# Patient Record
Sex: Male | Born: 1955
Health system: Southern US, Community
[De-identification: ages and names within clinical notes are randomized; demographics above are authoritative.]

## PROBLEM LIST (undated history)

## (undated) DIAGNOSIS — E669 Obesity, unspecified: Secondary | ICD-10-CM

## (undated) DIAGNOSIS — G4733 Obstructive sleep apnea (adult) (pediatric): Secondary | ICD-10-CM

## (undated) DIAGNOSIS — I82409 Acute embolism and thrombosis of unspecified deep veins of unspecified lower extremity: Secondary | ICD-10-CM

## (undated) DIAGNOSIS — S8292XA Unspecified fracture of left lower leg, initial encounter for closed fracture: Secondary | ICD-10-CM

## (undated) HISTORY — PX: EYE SURGERY: SHX253

## (undated) HISTORY — DX: Unspecified fracture of left lower leg, initial encounter for closed fracture: S82.92XA

## (undated) HISTORY — DX: Acute embolism and thrombosis of unspecified deep veins of unspecified lower extremity: I82.409

## (undated) HISTORY — DX: Obesity, unspecified: E66.9

## (undated) HISTORY — DX: Obstructive sleep apnea (adult) (pediatric): G47.33

---

## 1998-10-05 ENCOUNTER — Ambulatory Visit: Admission: RE | Admit: 1998-10-05 | Discharge: 1998-10-05 | Payer: Self-pay | Admitting: Internal Medicine

## 2003-04-19 ENCOUNTER — Ambulatory Visit (HOSPITAL_COMMUNITY): Admission: RE | Admit: 2003-04-19 | Discharge: 2003-04-20 | Payer: Self-pay | Admitting: Ophthalmology

## 2013-10-25 ENCOUNTER — Encounter: Payer: Self-pay | Admitting: General Surgery

## 2013-10-25 DIAGNOSIS — E669 Obesity, unspecified: Secondary | ICD-10-CM

## 2013-10-25 DIAGNOSIS — G4733 Obstructive sleep apnea (adult) (pediatric): Secondary | ICD-10-CM | POA: Insufficient documentation

## 2013-11-05 ENCOUNTER — Encounter: Payer: Self-pay | Admitting: Cardiology

## 2013-11-05 ENCOUNTER — Ambulatory Visit (INDEPENDENT_AMBULATORY_CARE_PROVIDER_SITE_OTHER): Payer: BC Managed Care – PPO | Admitting: Cardiology

## 2013-11-05 VITALS — BP 136/88 | HR 68 | Ht 74.5 in | Wt 280.0 lb

## 2013-11-05 DIAGNOSIS — G4733 Obstructive sleep apnea (adult) (pediatric): Secondary | ICD-10-CM

## 2013-11-05 DIAGNOSIS — E669 Obesity, unspecified: Secondary | ICD-10-CM

## 2013-11-05 NOTE — Progress Notes (Signed)
  [address] [place], [place]  [address] Phone: [phone]([phone] Fax:  [phone]) [phone]  Date:  11/05/2013   ID:  Bryan ShoveRichard V Stevenson, DOB 01/04/1956, MRN [id number]  PCP:  Lenora BoysFRIED, ROBERT L, MD  Cardiologist:  Armanda Magicraci Julianny Milstein, MD     History of Present Illness: Bryan Stevenson is a 58 y.o. male with a history of OSA on CPAP.  He is doing well with his CPAP therapy.  He tolerates his full face mask with any problems.  He feels the pressure is adequate.  He feels rested in the am and has no daytime sleepiness.  He denies any nasal congestion or eye irritation.  He is not doing as much exercise as he had been.   Wt Readings from Last 3 Encounters:  11/05/13 280 lb (127.007 kg)     Past Medical History  Diagnosis Date  . OSA (obstructive sleep apnea)     on CPAP per Dr Mayford Knifeurner- PSG 11/08 showed AHI of 12.55/hr  . Obesity     Current Outpatient Prescriptions  Medication Sig Dispense Refill  . aspirin 81 MG tablet Take 81 mg by mouth daily.      . Cholecalciferol (VITAMIN D) 2000 UNITS tablet Take 2,000 Units by mouth daily.      . Multiple Vitamins-Minerals (CENTRUM SILVER PO) Take by mouth daily.      . NON FORMULARY CPAP      . vitamin C (ASCORBIC ACID) 500 MG tablet Take 500 mg by mouth daily.       No current facility-administered medications for this visit.    Allergies:   No Known Allergies  Social History:  The patient  reports that he has never smoked. He does not have any smokeless tobacco history on file. He reports that he drinks alcohol. He reports that he does not use illicit drugs.   Family History:  The patient's family history is not on file.   ROS:  Please see the history of present illness.      All other systems reviewed and negative.   PHYSICAL EXAM: VS:  BP 136/88  Pulse 68  Ht 6' 2.5" (1.892 m)  Wt 280 lb (127.007 kg)  BMI 35.48 kg/m2 Well nourished, well developed, in no acute distress HEENT: normal Neck: no JVD Cardiac:  normal S1, S2; RRR; no  murmur Lungs:  clear to auscultation bilaterally, no wheezing, rhonchi or rales Abd: soft, nontender, no hepatomegaly Ext: no edema Skin: warm and dry Neuro:  CNs 2-12 intact, no focal abnormalities noted       ASSESSMENT AND PLAN:  1. OSA on CPAP and tolerating well.  His download today showed an AHI of 2.4/hr on 12cm H2O and is 100% compliant in using more than 4 hours nightly.  He will continue on current settings. 2. Obesity - I have encouraged him to get back in a routine exercise program.  Followup with me in 1 year  Signed, Armanda Magicraci Jamee Pacholski, MD 11/05/2013 3:33 PM

## 2013-11-05 NOTE — Patient Instructions (Signed)
Your physician recommends that you continue on your current medications as directed. Please refer to the Current Medication list given to you today. Your physician wants you to follow-up in: 12 months with Dr. Turner.  You will receive a reminder letter in the mail two months in advance. If you don't receive a letter, please call our office to schedule the follow-up appointment.  

## 2013-11-06 ENCOUNTER — Other Ambulatory Visit: Payer: Self-pay | Admitting: General Surgery

## 2013-11-06 DIAGNOSIS — G4733 Obstructive sleep apnea (adult) (pediatric): Secondary | ICD-10-CM

## 2013-11-08 ENCOUNTER — Ambulatory Visit: Payer: Self-pay | Admitting: Cardiology

## 2013-12-09 ENCOUNTER — Encounter: Payer: Self-pay | Admitting: Cardiology

## 2013-12-20 ENCOUNTER — Encounter: Payer: Self-pay | Admitting: General Surgery

## 2013-12-24 ENCOUNTER — Telehealth: Payer: Self-pay | Admitting: Cardiology

## 2013-12-24 NOTE — Telephone Encounter (Signed)
New message ° ° ° ° ° °Returning a nurses call from last week °

## 2013-12-24 NOTE — Telephone Encounter (Signed)
Follow up ° ° ° ° °Returning a nurses call °

## 2013-12-24 NOTE — Telephone Encounter (Signed)
Made pt aware of download results.

## 2014-02-10 ENCOUNTER — Encounter: Payer: Self-pay | Admitting: Cardiology

## 2014-02-14 ENCOUNTER — Encounter: Payer: Self-pay | Admitting: General Surgery

## 2014-08-07 ENCOUNTER — Encounter: Payer: Self-pay | Admitting: Cardiology

## 2014-11-05 ENCOUNTER — Ambulatory Visit (INDEPENDENT_AMBULATORY_CARE_PROVIDER_SITE_OTHER): Payer: BLUE CROSS/BLUE SHIELD | Admitting: Cardiology

## 2014-11-05 ENCOUNTER — Encounter: Payer: Self-pay | Admitting: Cardiology

## 2014-11-05 VITALS — BP 110/78 | HR 89 | Ht 74.5 in | Wt 283.8 lb

## 2014-11-05 DIAGNOSIS — E669 Obesity, unspecified: Secondary | ICD-10-CM | POA: Diagnosis not present

## 2014-11-05 DIAGNOSIS — G4733 Obstructive sleep apnea (adult) (pediatric): Secondary | ICD-10-CM

## 2014-11-05 NOTE — Progress Notes (Signed)
Cardiology Office Note   Date:  11/05/2014   ID:  Bryan ShoveRichard V Caracci, DOB 10/04/1955, MRN [id number]  PCP:  Lenora BoysFRIED, ROBERT L, MD    Chief Complaint  Patient presents with  . Follow-up    Sleep Apnea      History of Present Illness:  Bryan Stevenson is a 59 y.o. male with a history of OSA on CPAP. He is doing well with his CPAP therapy. He tolerates his full face mask with any problems. He feels the pressure is adequate. He feels rested in the am and has no daytime sleepiness. He denies any nasal congestion or eye irritation. He is walking and going to the gym for exercise.   Past Medical History  Diagnosis Date  . OSA (obstructive sleep apnea)     on CPAP per Dr Mayford Knifeurner- PSG 11/08 showed AHI of 12.55/hr  . Obesity     Past Surgical History  Procedure Laterality Date  . Eye surgery Right      Current Outpatient Prescriptions  Medication Sig Dispense Refill  . aspirin 81 MG tablet Take 81 mg by mouth daily.    . Cholecalciferol (VITAMIN D) 2000 UNITS tablet Take 2,000 Units by mouth daily.    . Multiple Vitamins-Minerals (CENTRUM SILVER PO) Take by mouth daily.    . NON FORMULARY CPAP     No current facility-administered medications for this visit.    Allergies:   Review of patient's allergies indicates no known allergies.    Social History:  The patient  reports that he has never smoked. He does not have any smokeless tobacco history on file. He reports that he drinks alcohol. He reports that he does not use illicit drugs.   Family History:  The patient's family history includes Breast cancer in his mother; Diabetes type II in his father; Stroke in his father.    ROS:  Please see the history of present illness.   Otherwise, review of systems are positive for none.   All other systems are reviewed and negative.    PHYSICAL EXAM: VS:  BP 110/78 mmHg  Pulse 89  Ht 6' 2.5" (1.892 m)  Wt 283 lb 12.8 oz (128.731 kg)  BMI 35.96 kg/m2  SpO2 94% , BMI Body mass  index is 35.96 kg/(m^2). GEN: Well nourished, well developed, in no acute distress HEENT: normal Neck: no JVD, carotid bruits, or masses Cardiac: RRR; no murmurs, rubs, or gallops,no edema  Respiratory:  clear to auscultation bilaterally, normal work of breathing GI: soft, nontender, nondistended, + BS MS: no deformity or atrophy Skin: warm and dry, no rash Neuro:  Strength and sensation are intact Psych: euthymic mood, full affect   EKG:  EKG is not ordered today.    Recent Labs: No results found for requested labs within last 365 days.    Lipid Panel No results found for: CHOL, TRIG, HDL, CHOLHDL, VLDL, LDLCALC, LDLDIRECT    Wt Readings from Last 3 Encounters:  11/05/14 283 lb 12.8 oz (128.731 kg)  11/05/13 280 lb (127.007 kg)     ASSESSMENT AND PLAN:  1. OSA on CPAP and tolerating well. His download today showed an AHI of 1.4/hr on 12cm H2O and is 99% compliant in using more than 4 hours nightly. He will continue on current settings. 2. Obesity - I have encouraged him to get back in a routine exercise program.   Current medicines are reviewed at length with the patient today.  The patient does not have  concerns regarding medicines.  The following changes have been made:  no change  Labs/ tests ordered today include: see above assessment and plan No orders of the defined types were placed in this encounter.     Disposition:   FU with me in 1 year   Signed, Quintella ReichertURNER,Taavi Hoose R, MD  11/05/2014 8:22 AM    [facility] [address], [place], [place]  [address] Phone: [phone](336) [phone]; Fax: [phone](336) [phone]

## 2014-11-05 NOTE — Patient Instructions (Signed)

## 2015-10-01 ENCOUNTER — Other Ambulatory Visit: Payer: Self-pay | Admitting: Orthopedic Surgery

## 2015-10-01 ENCOUNTER — Ambulatory Visit (HOSPITAL_COMMUNITY)
Admission: RE | Admit: 2015-10-01 | Discharge: 2015-10-01 | Disposition: A | Discharge status: Home / Self Care | Payer: Managed Care, Other (non HMO) | Source: Ambulatory Visit | Attending: Cardiology | Admitting: Cardiology

## 2015-10-01 DIAGNOSIS — I82442 Acute embolism and thrombosis of left tibial vein: Secondary | ICD-10-CM | POA: Diagnosis not present

## 2015-10-01 DIAGNOSIS — M79605 Pain in left leg: Secondary | ICD-10-CM | POA: Insufficient documentation

## 2015-10-01 DIAGNOSIS — M7989 Other specified soft tissue disorders: Secondary | ICD-10-CM | POA: Insufficient documentation

## 2015-10-01 DIAGNOSIS — M25562 Pain in left knee: Secondary | ICD-10-CM

## 2015-10-01 DIAGNOSIS — G4733 Obstructive sleep apnea (adult) (pediatric): Secondary | ICD-10-CM | POA: Insufficient documentation

## 2015-10-01 DIAGNOSIS — I82432 Acute embolism and thrombosis of left popliteal vein: Secondary | ICD-10-CM | POA: Diagnosis not present

## 2015-10-01 DIAGNOSIS — I82890 Acute embolism and thrombosis of other specified veins: Secondary | ICD-10-CM | POA: Diagnosis not present

## 2015-10-01 DIAGNOSIS — M79662 Pain in left lower leg: Secondary | ICD-10-CM

## 2015-10-03 ENCOUNTER — Ambulatory Visit
Admission: RE | Admit: 2015-10-03 | Discharge: 2015-10-03 | Disposition: A | Discharge status: Home / Self Care | Payer: Managed Care, Other (non HMO) | Source: Ambulatory Visit | Attending: Orthopedic Surgery | Admitting: Orthopedic Surgery

## 2015-10-03 DIAGNOSIS — M25562 Pain in left knee: Secondary | ICD-10-CM

## 2015-11-14 ENCOUNTER — Ambulatory Visit: Payer: BLUE CROSS/BLUE SHIELD | Admitting: Cardiology

## 2016-01-12 ENCOUNTER — Encounter: Payer: Self-pay | Admitting: Cardiology

## 2016-01-29 ENCOUNTER — Encounter (INDEPENDENT_AMBULATORY_CARE_PROVIDER_SITE_OTHER): Payer: Self-pay

## 2016-01-29 ENCOUNTER — Encounter: Payer: Self-pay | Admitting: Cardiology

## 2016-01-29 ENCOUNTER — Ambulatory Visit (INDEPENDENT_AMBULATORY_CARE_PROVIDER_SITE_OTHER): Payer: Managed Care, Other (non HMO) | Admitting: Cardiology

## 2016-01-29 VITALS — BP 108/82 | HR 88 | Ht 75.0 in | Wt 282.2 lb

## 2016-01-29 DIAGNOSIS — G4733 Obstructive sleep apnea (adult) (pediatric): Secondary | ICD-10-CM | POA: Diagnosis not present

## 2016-01-29 DIAGNOSIS — S8292XD Unspecified fracture of left lower leg, subsequent encounter for closed fracture with routine healing: Secondary | ICD-10-CM | POA: Diagnosis not present

## 2016-01-29 DIAGNOSIS — E669 Obesity, unspecified: Secondary | ICD-10-CM | POA: Diagnosis not present

## 2016-01-29 DIAGNOSIS — I82402 Acute embolism and thrombosis of unspecified deep veins of left lower extremity: Secondary | ICD-10-CM | POA: Diagnosis not present

## 2016-01-29 DIAGNOSIS — S8292XA Unspecified fracture of left lower leg, initial encounter for closed fracture: Secondary | ICD-10-CM | POA: Insufficient documentation

## 2016-01-29 DIAGNOSIS — I82409 Acute embolism and thrombosis of unspecified deep veins of unspecified lower extremity: Secondary | ICD-10-CM | POA: Insufficient documentation

## 2016-01-29 NOTE — Patient Instructions (Signed)

## 2016-01-29 NOTE — Progress Notes (Signed)
Cardiology Office Note    Date:  01/29/2016   ID:  NIKITA HUMBLE, DOB 1956/04/06, MRN [id number]  PCP:  Joycelyn Rua, MD  Cardiologist:  Armanda Magic, MD   Chief Complaint  Patient presents with  . Sleep Apnea    History of Present Illness:  Bryan Stevenson is a 60 y.o. male with a history of OSA on CPAP. He is doing well with his CPAP therapy. He tolerates his full face mask with any problems. He feels the pressure is adequate. He feels rested in the am and has no daytime sleepiness. He denies any nasal congestion or eye irritation. he is currently in PT after falling off a ladder and breaking his leg and then getting a DVT so he has not been working out.     Past Medical History:  Diagnosis Date  . DVT (deep venous thrombosis) (HCC)    during leg fracture  . Leg fracture, left    complicated by DVT  . Obesity   . OSA (obstructive sleep apnea)    on CPAP per Dr Mayford Knife- PSG 11/08 showed AHI of 12.55/hr    Past Surgical History:  Procedure Laterality Date  . EYE SURGERY Right     Current Medications: Outpatient Medications Prior to Visit  Medication Sig Dispense Refill  . aspirin 81 MG tablet Take 81 mg by mouth daily.    . Cholecalciferol (VITAMIN D) 2000 UNITS tablet Take 2,000 Units by mouth daily.    . Multiple Vitamins-Minerals (CENTRUM SILVER PO) Take by mouth daily.    . NON FORMULARY CPAP     No facility-administered medications prior to visit.      Allergies:   Review of patient's allergies indicates no known allergies.   Social History   Social History  . Marital status: Married    Spouse name: patricia  . Number of children: 2  . Years of education: college   Occupational History  . retired    Social History Main Topics  . Smoking status: Never Smoker  . Smokeless tobacco: Never Used  . Alcohol use Yes     Comment: 1-2 per week, wine  . Drug use: No  . Sexual activity: Not Asked   Other Topics Concern  . None   Social History  Narrative  . None     Family History:  The patient's family history includes Breast cancer in his mother; Diabetes type II in his father; Stroke in his father.   ROS:   Please see the history of present illness.    Review of Systems  Musculoskeletal: Positive for muscle cramps.   All other systems reviewed and are negative.   PHYSICAL EXAM:   VS:  BP 108/82   Pulse 88   Ht  (1.905 m)   Wt 282 lb 3.2 oz (128 kg)   SpO2 96%   BMI 35.27 kg/m    GEN: Well nourished, well developed, in no acute distress  HEENT: normal  Neck: no JVD, carotid bruits, or masses Cardiac: RRR; no murmurs, rubs, or gallops,no edema.  Intact distal pulses bilaterally.  Respiratory:  clear to auscultation bilaterally, normal work of breathing GI: soft, nontender, nondistended, + BS MS: no deformity or atrophy  Skin: warm and dry, no rash Neuro:  Alert and Oriented x 3, Strength and sensation are intact Psych: euthymic mood, full affect  Wt Readings from Last 3 Encounters:  01/29/16 282 lb 3.2 oz (128 kg)  11/05/14 283 lb 12.8 oz (  128.7 kg)  11/05/13 280 lb (127 kg)      Studies/Labs Reviewed:   EKG:  EKG is not ordered today.   Recent Labs: No results found for requested labs within last 8760 hours.   Lipid Panel No results found for: CHOL, TRIG, HDL, CHOLHDL, VLDL, LDLCALC, LDLDIRECT  Additional studies/ records that were reviewed today include:  CPAP d/l    ASSESSMENT:    1. OSA (obstructive sleep apnea)   2. Obesity   3. Leg fracture, left, closed, with routine healing, subsequent encounter   4. Deep vein thrombosis (DVT) of left lower extremity, unspecified chronicity, unspecified vein (HCC)      PLAN:  In order of problems listed above:  OSA - the patient is tolerating PAP therapy well without any problems. The PAP download was reviewed today and showed an AHI of 1.3/hr on 12 cm H2O with 100% compliance in using more than 4 hours nightly.  The patient has been using and  benefiting from CPAP use and will continue to benefit from therapy.  2.   Obesity - I have encouraged him to get into a routine exercise program and cut back on carbs and portions.     Medication Adjustments/Labs and Tests Ordered: Current medicines are reviewed at length with the patient today.  Concerns regarding medicines are outlined above.  Medication changes, Labs and Tests ordered today are listed in the Patient Instructions below.  There are no Patient Instructions on file for this visit.   Signed, Armanda Magicraci Turner, MD  01/29/2016 3:30 PM    [facility] [address], [place], [place]  [address] Phone: [phone](336) [phone]; Fax: [phone]

## 2016-02-11 ENCOUNTER — Telehealth: Payer: Self-pay | Admitting: Cardiology

## 2016-02-11 NOTE — Telephone Encounter (Signed)
New message    Pt verbalized that he is trying to have Dr.Turner to change the C_PAP supply company [facility] [phone]  He verbalized that he needs Dr.Turner to write him a new proscription for [facility]

## 2016-02-12 ENCOUNTER — Other Ambulatory Visit: Payer: Self-pay | Admitting: *Deleted

## 2016-02-12 DIAGNOSIS — Z9989 Dependence on other enabling machines and devices: Principal | ICD-10-CM

## 2016-02-12 DIAGNOSIS — G4733 Obstructive sleep apnea (adult) (pediatric): Secondary | ICD-10-CM

## 2016-02-12 NOTE — Telephone Encounter (Signed)
Follow Up:   Pt said he called yesterday and waiting to hear from you. He needs to talk to you about  Updating his prescription for C-Pap machine.r

## 2016-02-12 NOTE — Telephone Encounter (Signed)
Spoke with patient and he was unsure what all [facility] needed to be able to set him up for supplies.  Per his insurance, he as to go through them for supplies now.    I have called [facility] and have since faxed over:   1. Sleep Study/Titration 2. DX Code 3. Clinical Notes 4. Current Download 5. Demographic Sheet 6 Order for supplies.   Patient is aware that once [facility] gets everything and processes the order, that they will call him to let him know what he needs to do to get his supplies.    Patient was appreciative of the call, and thanked me for helping him and getting all this information to them so that he can get new supplies.

## 2017-01-24 ENCOUNTER — Encounter: Payer: Self-pay | Admitting: Cardiology

## 2017-01-24 ENCOUNTER — Ambulatory Visit (INDEPENDENT_AMBULATORY_CARE_PROVIDER_SITE_OTHER): Payer: Managed Care, Other (non HMO) | Admitting: Cardiology

## 2017-01-24 VITALS — BP 124/68 | HR 68 | Ht 75.0 in | Wt 280.4 lb

## 2017-01-24 DIAGNOSIS — G4733 Obstructive sleep apnea (adult) (pediatric): Secondary | ICD-10-CM | POA: Diagnosis not present

## 2017-01-24 DIAGNOSIS — Z6835 Body mass index (BMI) 35.0-35.9, adult: Secondary | ICD-10-CM | POA: Diagnosis not present

## 2017-01-24 NOTE — Progress Notes (Signed)
Cardiology Office Note:    Date:  01/24/2017   ID:  Bryan Stevenson, DOB 10/15/1955, MRN [id number]  PCP:  Bryan RuaMeyers, Stephen, MD  Cardiologist:  Bryan Magicraci Avi Kerschner, MD   Referring MD: Bryan RuaMeyers, Stephen, MD   Chief Complaint  Patient presents with  . Sleep Apnea    History of Present Illness:    Bryan Stevenson is a 61 y.o. male with a hx of a history of OSA on CPAP. He is here today for followup and is doing well with his CPAP therapy. He continues to tolerate his full face mask without any problems. He continues to feel the pressure is adequate and feels rested in the am with no significant daytime sleepiness. He denies any nasal congestion or eye irritation.He denies any dry mouth or nose. He started an exercise program a few weeks ago.  Past Medical History:  Diagnosis Date  . DVT (deep venous thrombosis) (HCC)    during leg fracture  . Leg fracture, left    complicated by DVT  . Obesity   . OSA (obstructive sleep apnea)    on CPAP per Dr Mayford Knifeurner- PSG 11/08 showed AHI of 12.55/hr    Past Surgical History:  Procedure Laterality Date  . EYE SURGERY Right     Current Medications: Current Meds  Medication Sig  . aspirin 81 MG tablet Take 81 mg by mouth daily.  Marland Kitchen. CALCIUM-VITAMIN D PO Take by mouth as directed.  . Multiple Vitamins-Minerals (CENTRUM SILVER PO) Take by mouth daily.  . NON FORMULARY CPAP     Allergies:   Patient has no known allergies.   Social History   Social History  . Marital status: Married    Spouse name: Bryan Stevenson  . Number of children: 2  . Years of education: college   Occupational History  . retired    Social History Main Topics  . Smoking status: Never Smoker  . Smokeless tobacco: Never Used  . Alcohol use Yes     Comment: 1-2 per week, wine  . Drug use: No  . Sexual activity: Not Asked   Other Topics Concern  . None   Social History Narrative  . None     Family History: The patient's family history includes Breast cancer in his  mother; Diabetes type II in his father; Stroke in his father.  ROS:   Please see the history of present illness.     All other systems reviewed and are negative.  EKGs/Labs/Other Studies Reviewed:    The following studies were reviewed today: CPAP download  EKG:  EKG is not ordered today.    Recent Labs: No results found for requested labs within last 8760 hours.   Recent Lipid Panel No results found for: CHOL, TRIG, HDL, CHOLHDL, VLDL, LDLCALC, LDLDIRECT  Physical Exam:    VS:  BP 124/68   Pulse 68   Ht 6\' 3"  (1.905 m)   Wt 280 lb 6.4 oz (127.2 kg)   SpO2 95%   BMI 35.05 kg/m     Wt Readings from Last 3 Encounters:  01/24/17 280 lb 6.4 oz (127.2 kg)  01/29/16 282 lb 3.2 oz (128 kg)  11/05/14 283 lb 12.8 oz (128.7 kg)     GEN:  Well nourished, well developed in no acute distress HEENT: Normal NECK: No JVD; No carotid bruits LYMPHATICS: No lymphadenopathy CARDIAC: RRR, no murmurs, rubs, gallops RESPIRATORY:  Clear to auscultation without rales, wheezing or rhonchi  ABDOMEN: Soft, non-tender, non-distended MUSCULOSKELETAL:  No  edema; No deformity  SKIN: Warm and dry NEUROLOGIC:  Alert and oriented x 3 PSYCHIATRIC:  Normal affect   ASSESSMENT:    1. OSA (obstructive sleep apnea)   2. Class 2 severe obesity due to excess calories with serious comorbidity and body mass index (BMI) of 35.0 to 35.9 in adult [facility])    PLAN:    In order of problems listed above:  OSA - the patient is tolerating PAP therapy well without any problems. The PAP download was reviewed today and showed an AHI of 1.1/hr on 12 cm H2O with 100% compliance in using more than 4 hours nightly.  The patient has been using and benefiting from CPAP use and will continue to benefit from therapy.  Obesity - I have encouraged him to continue in his routine exercise program and cut back on carbs and portions.    Medication Adjustments/Labs and Tests Ordered: Current medicines are reviewed at length  with the patient today.  Concerns regarding medicines are outlined above.  No orders of the defined types were placed in this encounter.  No orders of the defined types were placed in this encounter.   Signed, Bryan Magic, MD  01/24/2017 8:57 AM    [facility]

## 2017-01-24 NOTE — Patient Instructions (Signed)

## 2017-09-20 IMAGING — CT CT KNEE*L* W/O CM
1 series · 12 of 14 positions shown, 15 images · non-contrast
Comparison: None.

CLINICAL DATA: Right tibia and fibula fractures following a fall
from a ladder on 09/12/2015.

EXAM:
CT OF THE RIGHT KNEE WITHOUT CONTRAST
TECHNIQUE: Multidetector CT imaging of the right knee was performed according
to the standard protocol. Multiplanar CT image reconstructions were
also generated.

[Series 4: knee soft tissue · axial · 0.35mm/px · z∈[-239,-74]mm · 12 of 65 slices shown, 15 images]
[im 5/65  soft-tissue]
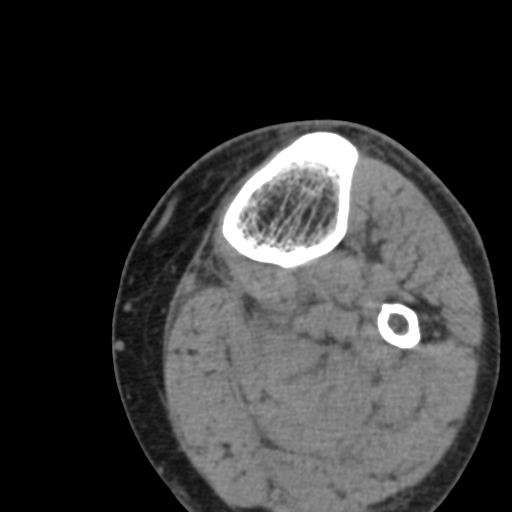
[im 5/65  bone]
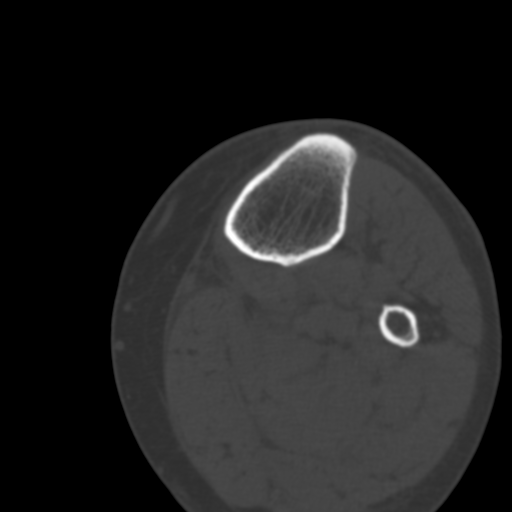
[im 10/65  bone]
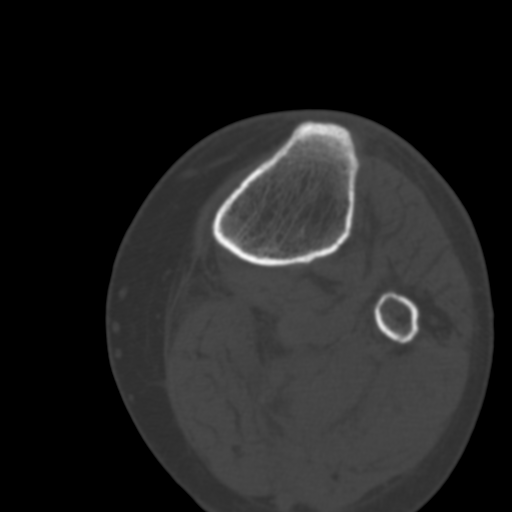
[im 15/65  bone]
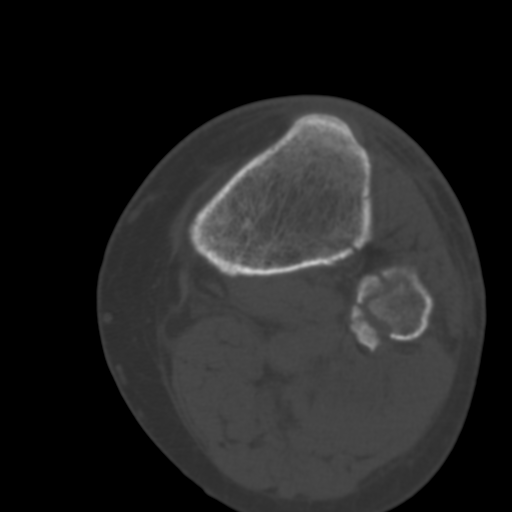
[im 20/65  bone]
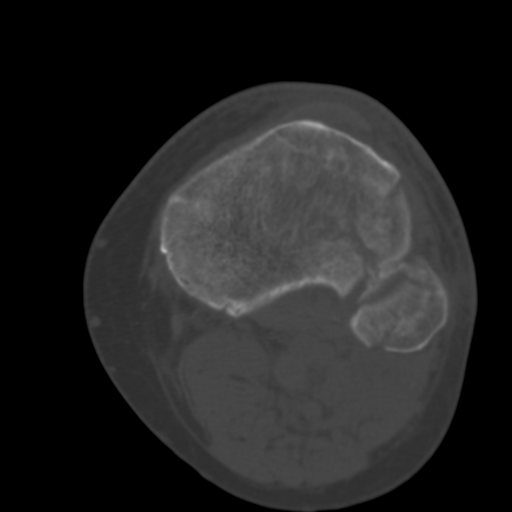
[im 25/65  soft-tissue]
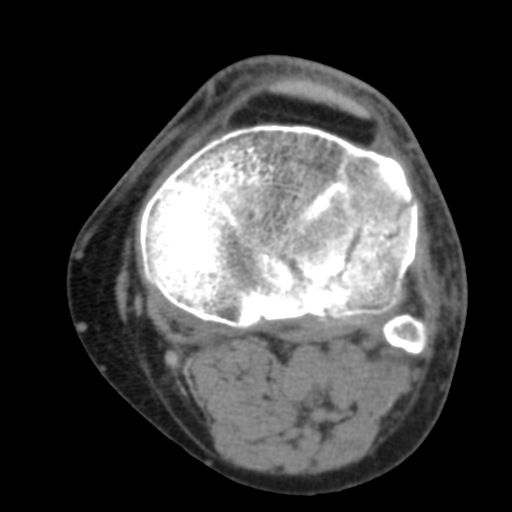
[im 25/65  bone]
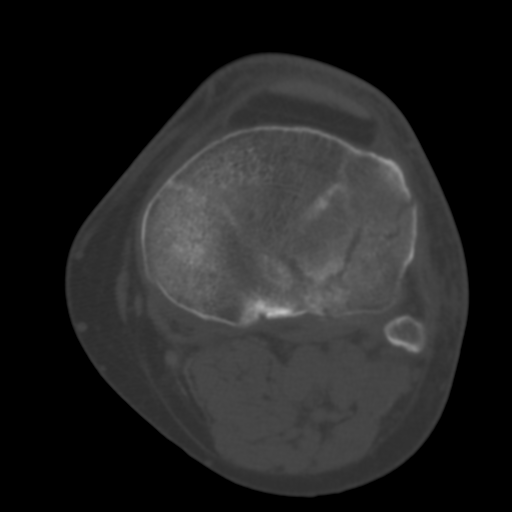
[im 30/65  bone]
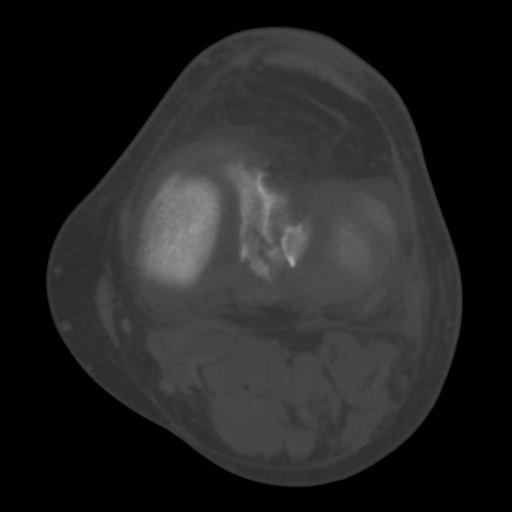
[im 35/65  bone]
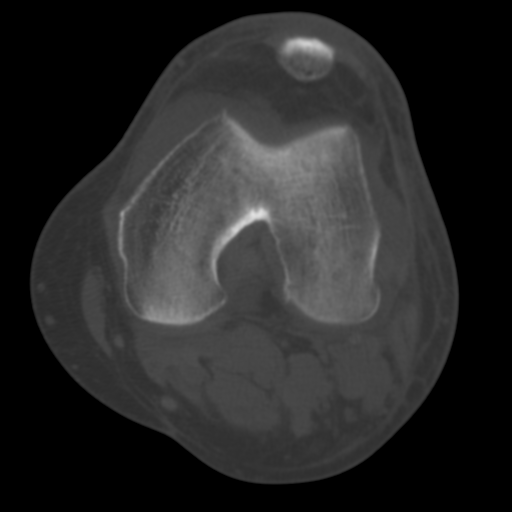
[im 40/65  bone]
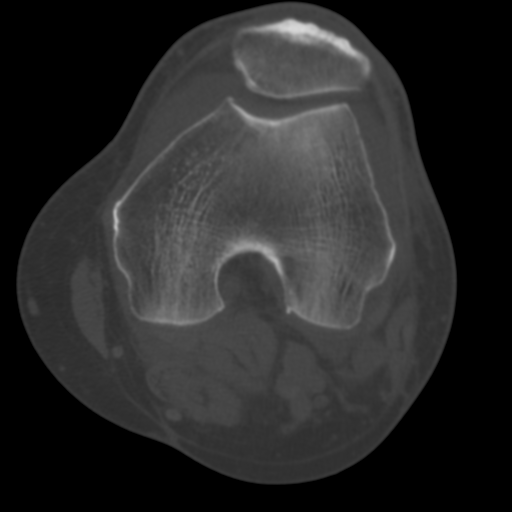
[im 45/65  soft-tissue]
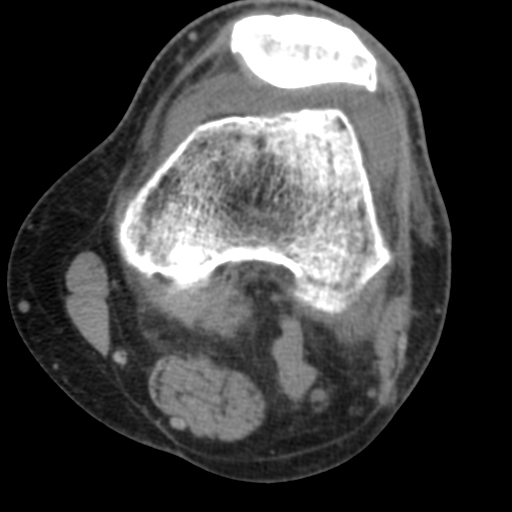
[im 45/65  bone]
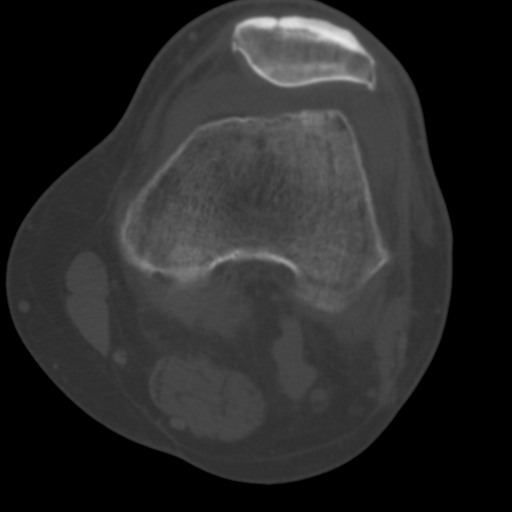
[im 50/65  bone]
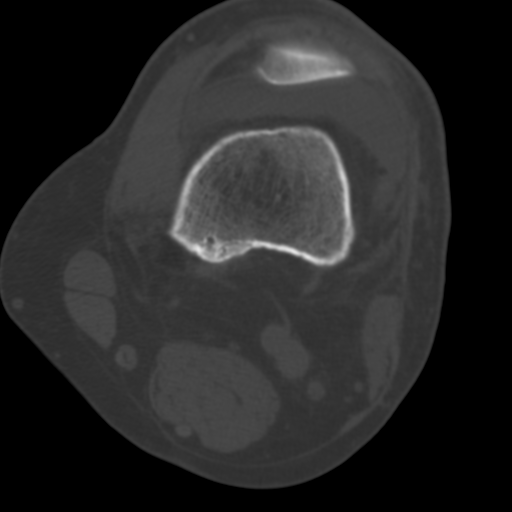
[im 55/65  bone]
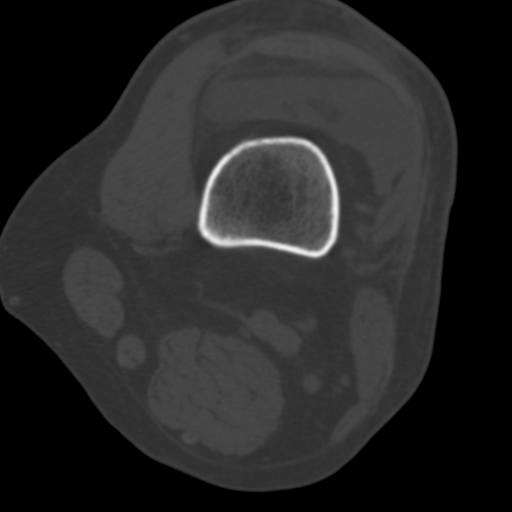
[im 60/65  bone]
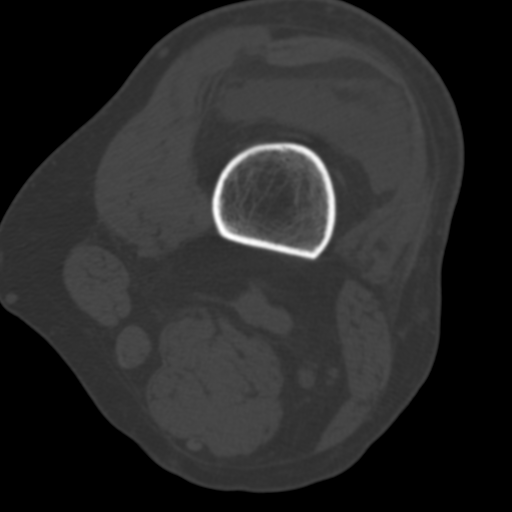

[12 of 14 positions shown; findings below may reference images not displayed]

FINDINGS: Comminuted lateral tibial plateau fracture, involving the tibial
spines. 3 mm of depression of the central portion of the lateral
tibial plateau. There is also a comminuted fracture of the fibular
head and neck. A moderately large effusion is noted.
IMPRESSION: 1. Comminuted lateral tibial plateau fracture with 3 mm of
depression centrally.
2. Comminuted fibular head and neck fracture.
3. Moderately large effusion.

## 2018-01-31 NOTE — Progress Notes (Signed)
Cardiology Office Note:    Date:  02/01/2018   ID:  Bryan Stevenson, DOB 11/19/1955, MRN [id number]  PCP:  Joycelyn RuaMeyers, Stephen, MD  Cardiologist:  No primary care provider on file.    Referring MD: Joycelyn RuaMeyers, Stephen, MD   Chief Complaint  Patient presents with  . Sleep Apnea    History of Present Illness:    Bryan Stevenson is a 62 y.o. male with a hx of OSA on CPAP.  He is doing well with his CPAP device.  He tolerates the mask and feels the pressure is adequate.  Since going on CPAP he feels rested in the am and has no significant daytime sleepiness.  He denies any significant mouth or nasal dryness or nasal congestion.  He does not think that he snores.     Past Medical History:  Diagnosis Date  . DVT (deep venous thrombosis) (HCC)    during leg fracture  . Leg fracture, left    complicated by DVT  . Obesity   . OSA (obstructive sleep apnea)    on CPAP per Dr Mayford Knifeurner- PSG 11/08 showed AHI of 12.55/hr    Past Surgical History:  Procedure Laterality Date  . EYE SURGERY Right     Current Medications: Current Meds  Medication Sig  . aspirin 81 MG tablet Take 81 mg by mouth daily.  Marland Kitchen. CALCIUM-VITAMIN D PO Take by mouth as directed.  . Multiple Vitamins-Minerals (CENTRUM SILVER PO) Take by mouth daily.  . NON FORMULARY CPAP     Allergies:   Patient has no known allergies.   Social History   Socioeconomic History  . Marital status: Married    Spouse name: patricia  . Number of children: 2  . Years of education: college  . Highest education level: Not on file  Occupational History  . Occupation: retired  Engineer, productionocial Needs  . Financial resource strain: Not on file  . Food insecurity:    Worry: Not on file    Inability: Not on file  . Transportation needs:    Medical: Not on file    Non-medical: Not on file  Tobacco Use  . Smoking status: Never Smoker  . Smokeless tobacco: Never Used  Substance and Sexual Activity  . Alcohol use: Yes    Comment: 1-2 per week, wine  .  Drug use: No  . Sexual activity: Not on file  Lifestyle  . Physical activity:    Days per week: Not on file    Minutes per session: Not on file  . Stress: Not on file  Relationships  . Social connections:    Talks on phone: Not on file    Gets together: Not on file    Attends religious service: Not on file    Active member of club or organization: Not on file    Attends meetings of clubs or organizations: Not on file    Relationship status: Not on file  Other Topics Concern  . Not on file  Social History Narrative  . Not on file     Family History: The patient's family history includes Breast cancer in his mother; Diabetes type II in his father; Stroke in his father.  ROS:   Please see the history of present illness.    ROS  All other systems reviewed and negative.   EKGs/Labs/Other Studies Reviewed:    The following studies were reviewed today: PAP download  EKG:  EKG is not ordered today.    Recent Labs: No  results found for requested labs within last 8760 hours.   Recent Lipid Panel No results found for: CHOL, TRIG, HDL, CHOLHDL, VLDL, LDLCALC, LDLDIRECT  Physical Exam:    VS:  BP 122/76   Pulse 89   Ht 6\' 3"  (1.905 m)   Wt 287 lb 12.8 oz (130.5 kg)   SpO2 97%   BMI 35.97 kg/m     Wt Readings from Last 3 Encounters:  02/01/18 287 lb 12.8 oz (130.5 kg)  01/24/17 280 lb 6.4 oz (127.2 kg)  01/29/16 282 lb 3.2 oz (128 kg)     GEN:  Well nourished, well developed in no acute distress HEENT: Normal NECK: No JVD; No carotid bruits LYMPHATICS: No lymphadenopathy CARDIAC: RRR, no murmurs, rubs, gallops RESPIRATORY:  Clear to auscultation without rales, wheezing or rhonchi  ABDOMEN: Soft, non-tender, non-distended MUSCULOSKELETAL:  No edema; No deformity  SKIN: Warm and dry NEUROLOGIC:  Alert and oriented x 3 PSYCHIATRIC:  Normal affect   ASSESSMENT:    1. OSA (obstructive sleep apnea)   2. Class 2 severe obesity due to excess calories with serious  comorbidity in adult, unspecified BMI (HCC)    PLAN:    In order of problems listed above:  1.  OSA - the patient is tolerating PAP therapy well without any problems. The PAP download was reviewed today and showed an AHI of 1.2/hr on 12 cm H2O with 100% compliance in using more than 4 hours nightly.  The patient has been using and benefiting from PAP use and will continue to benefit from therapy.   2.  Obesity - I have encouraged him to get into a routine exercise program and cut back on carbs and portions.     Medication Adjustments/Labs and Tests Ordered: Current medicines are reviewed at length with the patient today.  Concerns regarding medicines are outlined above.  No orders of the defined types were placed in this encounter.  No orders of the defined types were placed in this encounter.   Signed, Armanda Magicraci Turner, MD  02/01/2018 8:38 AM    [facility]

## 2018-02-01 ENCOUNTER — Ambulatory Visit (INDEPENDENT_AMBULATORY_CARE_PROVIDER_SITE_OTHER): Payer: Managed Care, Other (non HMO) | Admitting: Cardiology

## 2018-02-01 ENCOUNTER — Encounter: Payer: Self-pay | Admitting: Cardiology

## 2018-02-01 VITALS — BP 122/76 | HR 89 | Ht 75.0 in | Wt 287.8 lb

## 2018-02-01 DIAGNOSIS — G4733 Obstructive sleep apnea (adult) (pediatric): Secondary | ICD-10-CM

## 2018-02-01 NOTE — Patient Instructions (Signed)
Your physician recommends that you continue on your current medications as directed. Please refer to the Current Medication list given to you today. Your physician wants you to follow-up in: 1 year with Dr. Mayford Knifeurner.  You will receive a reminder letter in the mail two months in advance. If you don't receive a letter, please call our office to schedule the follow-up appointment. a

## 2018-08-02 ENCOUNTER — Telehealth: Payer: Self-pay | Admitting: Cardiology

## 2018-08-02 NOTE — Telephone Encounter (Signed)
° °  1) What problem are you experiencing? Request for order new cpap machine  2) Who is your medical equipment company? [employer]   Please route to the sleep study assistant.

## 2018-08-02 NOTE — Telephone Encounter (Signed)
Cpap is set on 12 cm H20

## 2018-08-03 NOTE — Telephone Encounter (Signed)
Does their insurance require an OV

## 2018-08-08 ENCOUNTER — Telehealth: Payer: Self-pay | Admitting: Cardiology

## 2018-08-08 DIAGNOSIS — G4733 Obstructive sleep apnea (adult) (pediatric): Secondary | ICD-10-CM

## 2018-08-08 NOTE — Telephone Encounter (Signed)
New Message ° ° °PT is returning phone call  ° °

## 2018-08-08 NOTE — Telephone Encounter (Signed)
Resmed CPAP at 12cm H2O with heated humidity and mask of choice

## 2018-08-08 NOTE — Telephone Encounter (Signed)
Order placed to [employer] via fax today.

## 2018-08-08 NOTE — Telephone Encounter (Signed)
Per [employer] patient is not eligible until Nov 11 2018 for a new cpap device. At that time we should send the order to [facility] and if an office visit is needed the appropriate department will contact our office.

## 2018-08-08 NOTE — Telephone Encounter (Signed)
Return call Patient does not want to wait until May when he is eligible for the insurance to pay for his unit but wants to private pay for a new unit. Please write a new prescription with the setting of 12 cm H20. 

## 2018-08-08 NOTE — Telephone Encounter (Signed)
I believe pt is returning call to Veda Canning, CMA about CPAP.

## 2018-08-08 NOTE — Telephone Encounter (Signed)
Return call Patient does not want to wait until May when he is eligible for the insurance to pay for his unit but wants to private pay for a new unit. Please write a new prescription with the setting of 12 cm H20.

## 2018-08-18 NOTE — Telephone Encounter (Signed)
Pt called and said company did not receive fax. Please re-send fax to [phone]

## 2019-02-08 ENCOUNTER — Telehealth: Payer: Self-pay

## 2019-02-08 NOTE — Telephone Encounter (Signed)
Attempted to call pt to switch appt to virtual and to obtain consent. No answer

## 2019-02-08 NOTE — Progress Notes (Signed)
Virtual Visit via Telephone Note   This visit type was conducted due to national recommendations for restrictions regarding the COVID-19 Pandemic (e.g. social distancing) in an effort to limit this patient's exposure and mitigate transmission in our community.  Due to his co-morbid illnesses, this patient is at least at moderate risk for complications without adequate follow up.  This format is felt to be most appropriate for this patient at this time.  The patient did not have access to video technology/had technical difficulties with video requiring transitioning to audio format only (telephone).  All issues noted in this document were discussed and addressed.  No physical exam could be performed with this format.  Please refer to the patient's chart for his  consent to telehealth for [facility].   Evaluation Performed:  Follow-up visit  This visit type was conducted due to national recommendations for restrictions regarding the COVID-19 Pandemic (e.g. social distancing).  This format is felt to be most appropriate for this patient at this time.  All issues noted in this document were discussed and addressed.  No physical exam was performed (except for noted visual exam findings with Video Visits).  Please refer to the patient's chart (MyChart message for video visits and phone note for telephone visits) for the patient's consent to telehealth for [facility].  Date:  02/09/2019   ID:  Bryan Stevenson, DOB 02/10/1956, MRN [id number]  Patient Location:  Home  Provider location:   [place]  PCP:  Joycelyn RuaMeyers, Stephen, MD  Sleep Medicine:  Armanda Magicraci Turner, MD Electrophysiologist:  None   Chief Complaint:  OSA  History of Present Illness:    Bryan Stevenson is a 63 y.o. male who presents via audio/video conferencing for a telehealth visit today.    Bryan Stevenson is a 63 y.o. male with a hx of mild OSA with AHI 12.55/hr by PSG in 2008 and has been on CPAP therapy.  He is doing well with  his CPAP device and thinks that he has gotten used to it.  He tolerates the mask and feels the pressure is adequate.  Since going on CPAP he feels rested in the am and has no significant daytime sleepiness.  He denies any significant mouth or nasal dryness or nasal congestion.  He does not think that he snores.    The patient does not have symptoms concerning for COVID-19 infection (fever, chills, cough, or new shortness of breath).    Prior CV studies:   The following studies were reviewed today:  None  Past Medical History:  Diagnosis Date  . DVT (deep venous thrombosis) (HCC)    during leg fracture  . Leg fracture, left    complicated by DVT  . Obesity   . OSA (obstructive sleep apnea)    on CPAP per Dr Mayford Knifeurner- PSG 11/08 showed AHI of 12.55/hr   Past Surgical History:  Procedure Laterality Date  . EYE SURGERY Right      Current Meds  Medication Sig  . Ascorbic Acid (VITAMIN C PO) Take by mouth.  Marland Kitchen. aspirin 81 MG tablet Take 81 mg by mouth daily.  Marland Kitchen. CALCIUM-VITAMIN D PO Take by mouth as directed.  . Multiple Vitamins-Minerals (CENTRUM SILVER PO) Take by mouth daily.  . NON FORMULARY CPAP     Allergies:   Patient has no known allergies.   Social History   Tobacco Use  . Smoking status: Never Smoker  . Smokeless tobacco: Never Used  Substance Use Topics  .  Alcohol use: Yes    Comment: 1-2 per week, wine  . Drug use: No     Family Hx: The patient's family history includes Breast cancer in his mother; Diabetes type II in his father; Stroke in his father.  ROS:   Please see the history of present illness.     All other systems reviewed and are negative.   Labs/Other Tests and Data Reviewed:    Recent Labs: No results found for requested labs within last 8760 hours.   Recent Lipid Panel No results found for: CHOL, TRIG, HDL, CHOLHDL, LDLCALC, LDLDIRECT  Wt Readings from Last 3 Encounters:  02/09/19 280 lb (127 kg)  02/01/18 287 lb 12.8 oz (130.5 kg)   01/24/17 280 lb 6.4 oz (127.2 kg)     Objective:    Vital Signs:  BP 128/79   Pulse 69 Comment: not available  Ht 6\' 2"  (1.88 m)   Wt 280 lb (127 kg)   BMI 35.95 kg/m    ASSESSMENT & PLAN:    1.  OSA - The patient is tolerating PAP therapy well without any problems. The PAP download was reviewed today and showed an AHI of 1.5/hr on 12 cm H2O with 100% compliance in using more than 4 hours nightly.  The patient has been using and benefiting from PAP use and will continue to benefit from therapy.   2.  Obesity - He has lost 7lbs from last year.  He has not been able to go to the gym due to [place].  I have encouraged him to get into a routine exercise program at home and cut back on carbs and portions.    COVID-19 Education: The signs and symptoms of COVID-19 were discussed with the patient and how to seek care for testing (follow up with PCP or arrange E-visit).  The importance of social distancing was discussed today.  Patient Risk:   After full review of this patient's clinical status, I feel that they are at least moderate risk at this time.  Time:   Today, I have spent 15 minutes directly with the patient on telemedicine discussing medical problems including OSA, obesity.  We also reviewed the symptoms of COVID 19 and the ways to protect against contracting the virus with telehealth technology.  I spent an additional 5 minutes reviewing patient's chart including PAP download.  Medication Adjustments/Labs and Tests Ordered: Current medicines are reviewed at length with the patient today.  Concerns regarding medicines are outlined above.  Tests Ordered: No orders of the defined types were placed in this encounter.  Medication Changes: No orders of the defined types were placed in this encounter.   Disposition:  Follow up in 1 year(s)  Signed, Fransico Him, MD  02/09/2019 8:35 AM    [facility]

## 2019-02-09 ENCOUNTER — Encounter: Payer: Self-pay | Admitting: Cardiology

## 2019-02-09 ENCOUNTER — Telehealth (INDEPENDENT_AMBULATORY_CARE_PROVIDER_SITE_OTHER): Payer: Managed Care, Other (non HMO) | Admitting: Cardiology

## 2019-02-09 ENCOUNTER — Other Ambulatory Visit: Payer: Self-pay

## 2019-02-09 VITALS — BP 128/79 | HR 69 | Ht 74.0 in | Wt 280.0 lb

## 2019-02-09 DIAGNOSIS — G4733 Obstructive sleep apnea (adult) (pediatric): Secondary | ICD-10-CM

## 2019-02-09 NOTE — Patient Instructions (Signed)

## 2020-02-19 ENCOUNTER — Encounter: Payer: Self-pay | Admitting: Cardiology

## 2020-02-19 ENCOUNTER — Ambulatory Visit (INDEPENDENT_AMBULATORY_CARE_PROVIDER_SITE_OTHER): Payer: Managed Care, Other (non HMO) | Admitting: Cardiology

## 2020-02-19 ENCOUNTER — Other Ambulatory Visit: Payer: Self-pay

## 2020-02-19 VITALS — BP 118/75 | HR 70 | Ht 74.0 in | Wt 285.0 lb

## 2020-02-19 DIAGNOSIS — G4733 Obstructive sleep apnea (adult) (pediatric): Secondary | ICD-10-CM

## 2020-02-19 NOTE — Patient Instructions (Signed)

## 2020-02-19 NOTE — Progress Notes (Signed)
Date:  02/19/2020   ID:  Viann Shove, DOB 1955-09-11, MRN [id number]   PCP:  Joycelyn Rua, MD  Sleep Medicine:  Armanda Magic, MD Electrophysiologist:  None   Chief Complaint:  OSA  History of Present Illness:    Bryan Stevenson is a 64 y.o. male with a hx of mild OSA with AHI 12.55/hr by PSG in 2008 and has been on CPAP therapy.  He is doing well with his CPAP device and thinks that he has gotten used to it.  He tolerates the mask and feels the pressure is adequate.  Since going on CPAP he feels rested in the am and has no significant daytime sleepiness.  He denies any significant mouth or nasal dryness or nasal congestion.  He does not think that he snores.     Prior CV studies:   The following studies were reviewed today:  PAP compliance download  Past Medical History:  Diagnosis Date  . DVT (deep venous thrombosis) (HCC)    during leg fracture  . Leg fracture, left    complicated by DVT  . Obesity   . OSA (obstructive sleep apnea)    on CPAP per Dr Mayford Knife- PSG 11/08 showed AHI of 12.55/hr   Past Surgical History:  Procedure Laterality Date  . EYE SURGERY Right      Current Meds  Medication Sig  . Ascorbic Acid (VITAMIN C PO) Take by mouth.  Marland Kitchen aspirin 81 MG tablet Take 81 mg by mouth daily.  Marland Kitchen CALCIUM-VITAMIN D PO Take by mouth as directed.  . Multiple Vitamins-Minerals (CENTRUM SILVER PO) Take by mouth daily.  . NON FORMULARY CPAP     Allergies:   Patient has no known allergies.   Social History   Tobacco Use  . Smoking status: Never Smoker  . Smokeless tobacco: Never Used  Substance Use Topics  . Alcohol use: Yes    Comment: 1-2 per week, wine  . Drug use: No     Family Hx: The patient's family history includes Breast cancer in his mother; Diabetes type II in his father; Stroke in his father.  ROS:   Please see the history of present illness.     All other systems reviewed and are negative.   Labs/Other Tests and Data Reviewed:    Recent  Labs: No results found for requested labs within last 8760 hours.   Recent Lipid Panel No results found for: CHOL, TRIG, HDL, CHOLHDL, LDLCALC, LDLDIRECT  Wt Readings from Last 3 Encounters:  02/19/20 285 lb (129.3 kg)  02/09/19 280 lb (127 kg)  02/01/18 287 lb 12.8 oz (130.5 kg)     Objective:    Vital Signs:  BP 118/75   Pulse 70   Ht 6\' 2"  (1.88 m)   Wt 285 lb (129.3 kg)   SpO2 91%   BMI 36.59 kg/m    GEN: Well nourished, well developed in no acute distress HEENT: Normal NECK: No JVD; No carotid bruits LYMPHATICS: No lymphadenopathy CARDIAC:RRR, no murmurs, rubs, gallops RESPIRATORY:  Clear to auscultation without rales, wheezing or rhonchi  ABDOMEN: Soft, non-tender, non-distended MUSCULOSKELETAL:  No edema; No deformity  SKIN: Warm and dry NEUROLOGIC:  Alert and oriented x 3 PSYCHIATRIC:  Normal affect    ASSESSMENT & PLAN:    1.  OSA -The patient is tolerating PAP therapy well without any problems. The PAP download was reviewed today and showed an AHI of 1.2/hr on 12 cm H2O with 100% compliance in using more  than 4 hours nightly.  The patient has been using and benefiting from PAP use and will continue to benefit from therapy.   2.  Obesity  - I have encouraged him to get into a routine exercise program at home and cut back on carbs and portions.   Medication Adjustments/Labs and Tests Ordered: Current medicines are reviewed at length with the patient today.  Concerns regarding medicines are outlined above.  Tests Ordered: No orders of the defined types were placed in this encounter.  Medication Changes: No orders of the defined types were placed in this encounter.   Disposition:  Follow up in 1 year(s)  Signed, Armanda Magic, MD  02/19/2020 10:35 AM    [facility]

## 2021-03-19 ENCOUNTER — Other Ambulatory Visit: Payer: Self-pay

## 2021-03-19 ENCOUNTER — Encounter: Payer: Self-pay | Admitting: Cardiology

## 2021-03-19 ENCOUNTER — Telehealth (INDEPENDENT_AMBULATORY_CARE_PROVIDER_SITE_OTHER): Payer: Medicare Other | Admitting: Cardiology

## 2021-03-19 VITALS — BP 136/88 | HR 68 | Ht 74.0 in | Wt 281.0 lb

## 2021-03-19 DIAGNOSIS — G4733 Obstructive sleep apnea (adult) (pediatric): Secondary | ICD-10-CM

## 2021-03-19 NOTE — Patient Instructions (Signed)

## 2021-03-19 NOTE — Progress Notes (Signed)
Virtual Visit via Video Note   This visit type was conducted due to national recommendations for restrictions regarding the COVID-19 Pandemic (e.g. social distancing) in an effort to limit this patient's exposure and mitigate transmission in our community.  Due to his co-morbid illnesses, this patient is at least at moderate risk for complications without adequate follow up.  This format is felt to be most appropriate for this patient at this time.  All issues noted in this document were discussed and addressed.  A limited physical exam was performed with this format.  Please refer to the patient's chart for his consent to telehealth for [facility].  Date:  03/19/2021   ID:  Bryan Stevenson, DOB July 11, 1955, MRN [id number] The patient was identified using 2 identifiers.  Patient Location: Home Provider Location: Home Office   PCP:  Joycelyn Rua, MD   [facility] Providers Cardiologist:  None Sleep Medicine:  Armanda Magic, MD     Evaluation Performed:  Follow-Up Visit  Chief Complaint:  OSA  History of Present Illness:    Bryan Stevenson is a 65 y.o. male with  with a hx of mild OSA with AHI 12.55/hr by PSG in 2008 and has been on CPAP therapy. He is doing well with his CPAP device and thinks that he has gotten used to it.  He tolerates the mask and feels the pressure is adequate.  Since going on CPAP he feels rested in the am and has no significant daytime sleepiness.  He denies any significant mouth or nasal dryness or nasal congestion.  He does not think that he snores.     The patient does not have symptoms concerning for COVID-19 infection (fever, chills, cough, or new shortness of breath).    Past Medical History:  Diagnosis Date   DVT (deep venous thrombosis) (HCC)    during leg fracture   Leg fracture, left    complicated by DVT   Obesity    OSA (obstructive sleep apnea)    on CPAP per Dr Mayford Knife- PSG 11/08 showed AHI of 12.55/hr   Past Surgical History:   Procedure Laterality Date   EYE SURGERY Right      No outpatient medications have been marked as taking for the 03/19/21 encounter (Video Visit) with Quintella Reichert, MD.     Allergies:   Patient has no known allergies.   Social History   Tobacco Use   Smoking status: Never   Smokeless tobacco: Never  Substance Use Topics   Alcohol use: Yes    Comment: 1-2 per week, wine   Drug use: No     Family Hx: The patient's family history includes Breast cancer in his mother; Diabetes type II in his father; Stroke in his father.  ROS:   Please see the history of present illness.     All other systems reviewed and are negative.   Prior CV studies:   The following studies were reviewed today:  PAP compliance download  Labs/Other Tests and Data Reviewed:    EKG:  No ECG reviewed.  Recent Labs: No results found for requested labs within last 8760 hours.   Recent Lipid Panel No results found for: CHOL, TRIG, HDL, CHOLHDL, LDLCALC, LDLDIRECT  Wt Readings from Last 3 Encounters:  03/19/21 281 lb (127.5 kg)  02/19/20 285 lb (129.3 kg)  02/09/19 280 lb (127 kg)     Risk Assessment/Calculations:          Objective:    Vital  Signs:  BP 136/88   Pulse 68   Ht 6\' 2"  (1.88 m)   Wt 281 lb (127.5 kg)   BMI 36.08 kg/m    VITAL SIGNS:  reviewed GEN:  no acute distress EYES:  sclerae anicteric, EOMI - Extraocular Movements Intact RESPIRATORY:  normal respiratory effort, symmetric expansion CARDIOVASCULAR:  no peripheral edema SKIN:  no rash, lesions or ulcers. MUSCULOSKELETAL:  no obvious deformities. NEURO:  alert and oriented x 3, no obvious focal deficit PSYCH:  normal affect  ASSESSMENT & PLAN:    OSA - The patient is tolerating PAP therapy well without any problems. The PAP download performed by his DME was personally reviewed and interpreted by me today and showed an AHI of 1.6/hr on 12 cm H2O with 100% compliance in using more than 4 hours nightly.  The patient  has been using and benefiting from PAP use and will continue to benefit from therapy.    COVID-19 Education: The signs and symptoms of COVID-19 were discussed with the patient and how to seek care for testing (follow up with PCP or arrange E-visit).  The importance of social distancing was discussed today.  Time:   Today, I have spent 15 minutes with the patient with telehealth technology discussing the above problems.     Medication Adjustments/Labs and Tests Ordered: Current medicines are reviewed at length with the patient today.  Concerns regarding medicines are outlined above.   Tests Ordered: No orders of the defined types were placed in this encounter.   Medication Changes: No orders of the defined types were placed in this encounter.   Follow Up:  In Person in 1 year(s)  Signed, , MD  03/19/2021 8:49 AM    [facility]

## 2021-05-05 DIAGNOSIS — Z23 Encounter for immunization: Secondary | ICD-10-CM | POA: Diagnosis not present

## 2022-06-01 ENCOUNTER — Ambulatory Visit: Payer: Managed Care, Other (non HMO) | Attending: Cardiology | Admitting: Cardiology

## 2022-06-01 ENCOUNTER — Encounter: Payer: Self-pay | Admitting: Cardiology

## 2022-06-01 VITALS — BP 130/87 | HR 86 | Ht 75.0 in | Wt 280.0 lb

## 2022-06-01 DIAGNOSIS — G4733 Obstructive sleep apnea (adult) (pediatric): Secondary | ICD-10-CM

## 2022-06-01 NOTE — Progress Notes (Signed)
Virtual Visit via Video Note   This visit type was conducted due to national recommendations for restrictions regarding the COVID-19 Pandemic (e.g. social distancing) in an effort to limit this patient's exposure and mitigate transmission in our community.  Due to his co-morbid illnesses, this patient is at least at moderate risk for complications without adequate follow up.  This format is felt to be most appropriate for this patient at this time.  All issues noted in this document were discussed and addressed.  A limited physical exam was performed with this format.  Please refer to the patient's chart for his consent to telehealth for [facility].  Date:  06/01/2022   ID:  Bryan Stevenson, DOB 10/13/1955, MRN [id number] The patient was identified using 2 identifiers.  Patient Location: Home Provider Location: Home Office   PCP:  Joycelyn Rua, MD   [facility] Providers Cardiologist:  None Sleep Medicine:  Armanda Magic, MD     Evaluation Performed:  Follow-Up Visit  Chief Complaint:  OSA  History of Present Illness:    Bryan Stevenson is a 66 y.o. male with  with a hx of mild OSA with AHI 12.55/hr by PSG in 2008 and has been on CPAP therapy. He is doing well with his PAP device and thinks that he has gotten used to it.  He tolerates the mask and feels the pressure is adequate.  Since going on PAP he feels rested in the am and has no significant daytime sleepiness.  He denies any significant mouth or nasal dryness or nasal congestion.  He does not think that he snores.     The patient does not have symptoms concerning for COVID-19 infection (fever, chills, cough, or new shortness of breath).    Past Medical History:  Diagnosis Date   DVT (deep venous thrombosis) (HCC)    during leg fracture   Leg fracture, left    complicated by DVT   Obesity    OSA (obstructive sleep apnea)    on CPAP per Dr Mayford Knife- PSG 11/08 showed AHI of 12.55/hr   Past Surgical History:   Procedure Laterality Date   EYE SURGERY Right      No outpatient medications have been marked as taking for the 06/01/22 encounter (Video Visit) with Quintella Reichert, MD.     Allergies:   Patient has no known allergies.   Social History   Tobacco Use   Smoking status: Never   Smokeless tobacco: Never  Substance Use Topics   Alcohol use: Yes    Comment: 1-2 per week, wine   Drug use: No     Family Hx: The patient's family history includes Breast cancer in his mother; Diabetes type II in his father; Stroke in his father.  ROS:   Please see the history of present illness.     All other systems reviewed and are negative.   Prior CV studies:   The following studies were reviewed today:  PAP compliance download  Labs/Other Tests and Data Reviewed:    EKG:  No ECG reviewed.  Recent Labs: No results found for requested labs within last 365 days.   Recent Lipid Panel No results found for: "CHOL", "TRIG", "HDL", "CHOLHDL", "LDLCALC", "LDLDIRECT"  Wt Readings from Last 3 Encounters:  06/01/22 280 lb (127 kg)  03/19/21 281 lb (127.5 kg)  02/19/20 285 lb (129.3 kg)     Risk Assessment/Calculations:          Objective:    Vital  Signs:  BP 130/87   Pulse 86   Ht 6\' 3"  (1.905 m)   Wt 280 lb (127 kg)   BMI 35.00 kg/m   Well nourished, well developed male in no acute distress. Well appearing, alert and conversant, regular work of breathing,  good skin color  Eyes- anicteric mouth- oral mucosa is pink  neuro- grossly intact skin- no apparent rash or lesions or cyanosis ASSESSMENT & PLAN:    OSA - The patient is tolerating PAP therapy well without any problems.  The patient has been using and benefiting from PAP use and will continue to benefit from therapy.  -for some reason his device is not recording on [employer] so I will call the DME to get a download   COVID-19 Education: The signs and symptoms of COVID-19 were discussed with the patient and how to seek  care for testing (follow up with PCP or arrange E-visit).  The importance of social distancing was discussed today.  Time:   Today, I have spent 10 minutes with the patient with telehealth technology discussing the above problems.     Medication Adjustments/Labs and Tests Ordered: Current medicines are reviewed at length with the patient today.  Concerns regarding medicines are outlined above.   Tests Ordered: No orders of the defined types were placed in this encounter.    Medication Changes: No orders of the defined types were placed in this encounter.    Follow Up:  In Person in 1 year(s)  Signed, , MD  06/01/2022 9:49 AM    [facility]

## 2022-06-01 NOTE — Patient Instructions (Signed)
Medication Instructions:  Your physician recommends that you continue on your current medications as directed. Please refer to the Current Medication list given to you today.  *If you need a refill on your cardiac medications before your next appointment, please call your pharmacy*   Follow-Up: At [facility], you and your health needs are our priority.  As part of our continuing mission to provide you with exceptional heart care, we have created designated Provider Care Teams.  These Care Teams include your primary Cardiologist (physician) and Advanced Practice Providers (APPs -  Physician Assistants and Nurse Practitioners) who all work together to provide you with the care you need, when you need it.   Your next appointment:   1 year(s)  The format for your next appointment:   In Person  Provider:   Dr. Turner   Important Information About Sugar       

## 2022-07-21 DIAGNOSIS — H5213 Myopia, bilateral: Secondary | ICD-10-CM | POA: Diagnosis not present

## 2022-07-21 DIAGNOSIS — H43813 Vitreous degeneration, bilateral: Secondary | ICD-10-CM | POA: Diagnosis not present

## 2022-07-21 DIAGNOSIS — H524 Presbyopia: Secondary | ICD-10-CM | POA: Diagnosis not present

## 2022-07-21 DIAGNOSIS — H31001 Unspecified chorioretinal scars, right eye: Secondary | ICD-10-CM | POA: Diagnosis not present

## 2022-07-21 DIAGNOSIS — H2513 Age-related nuclear cataract, bilateral: Secondary | ICD-10-CM | POA: Diagnosis not present

## 2022-09-28 DIAGNOSIS — E782 Mixed hyperlipidemia: Secondary | ICD-10-CM | POA: Diagnosis not present

## 2022-09-28 DIAGNOSIS — Z0000 Encounter for general adult medical examination without abnormal findings: Secondary | ICD-10-CM | POA: Diagnosis not present

## 2022-09-29 DIAGNOSIS — Z0000 Encounter for general adult medical examination without abnormal findings: Secondary | ICD-10-CM | POA: Diagnosis not present

## 2022-09-29 DIAGNOSIS — E785 Hyperlipidemia, unspecified: Secondary | ICD-10-CM | POA: Diagnosis not present

## 2022-09-29 DIAGNOSIS — G4733 Obstructive sleep apnea (adult) (pediatric): Secondary | ICD-10-CM | POA: Diagnosis not present

## 2022-09-29 DIAGNOSIS — Z9989 Dependence on other enabling machines and devices: Secondary | ICD-10-CM | POA: Diagnosis not present

## 2022-09-29 DIAGNOSIS — Z23 Encounter for immunization: Secondary | ICD-10-CM | POA: Diagnosis not present

## 2022-10-13 DIAGNOSIS — J40 Bronchitis, not specified as acute or chronic: Secondary | ICD-10-CM | POA: Diagnosis not present

## 2023-03-04 DIAGNOSIS — Z23 Encounter for immunization: Secondary | ICD-10-CM | POA: Diagnosis not present

## 2023-04-20 DIAGNOSIS — R051 Acute cough: Secondary | ICD-10-CM | POA: Diagnosis not present

## 2023-04-20 DIAGNOSIS — H6693 Otitis media, unspecified, bilateral: Secondary | ICD-10-CM | POA: Diagnosis not present

## 2023-04-20 DIAGNOSIS — H9203 Otalgia, bilateral: Secondary | ICD-10-CM | POA: Diagnosis not present

## 2023-06-09 DIAGNOSIS — J069 Acute upper respiratory infection, unspecified: Secondary | ICD-10-CM | POA: Diagnosis not present

## 2023-06-09 DIAGNOSIS — R053 Chronic cough: Secondary | ICD-10-CM | POA: Diagnosis not present

## 2023-07-26 DIAGNOSIS — H524 Presbyopia: Secondary | ICD-10-CM | POA: Diagnosis not present

## 2023-08-16 DIAGNOSIS — K080 Exfoliation of teeth due to systemic causes: Secondary | ICD-10-CM | POA: Diagnosis not present

## 2023-09-22 ENCOUNTER — Ambulatory Visit: Attending: Cardiology | Admitting: Cardiology

## 2023-09-22 ENCOUNTER — Telehealth: Admitting: Cardiology

## 2023-09-22 ENCOUNTER — Ambulatory Visit: Admitting: Cardiology

## 2023-09-22 VITALS — BP 120/80 | HR 86 | Ht 75.0 in | Wt 270.0 lb

## 2023-09-22 VITALS — BP 128/80 | HR 86 | Ht 75.0 in | Wt 270.0 lb

## 2023-09-22 DIAGNOSIS — G4733 Obstructive sleep apnea (adult) (pediatric): Secondary | ICD-10-CM

## 2023-09-22 NOTE — Patient Instructions (Signed)
 Medication Instructions:  Your physician recommends that you continue on your current medications as directed. Please refer to the Current Medication list given to you today.  *If you need a refill on your cardiac medications before your next appointment, please call your pharmacy*  Follow-Up: At [facility], you and your health needs are our priority.  As part of our continuing mission to provide you with exceptional heart care, we have created designated Provider Care Teams.  These Care Teams include your primary Cardiologist (physician) and Advanced Practice Providers (APPs -  Physician Assistants and Nurse Practitioners) who all work together to provide you with the care you need, when you need it.  We recommend signing up for the patient portal called "MyChart".  Sign up information is provided on this After Visit Summary.  MyChart is used to connect with patients for Virtual Visits (Telemedicine).  Patients are able to view lab/test results, encounter notes, upcoming appointments, etc.  Non-urgent messages can be sent to your provider as well.   To learn more about what you can do with MyChart, go to [email or web address].    Your next appointment:   1 year(s)  The format for your next appointment:   In Person  Provider:   Armanda Magic, MD {  Other Instructions   1st Floor: - Lobby - Registration  - Pharmacy  - Lab - Cafe  2nd Floor: - PV Lab - Diagnostic Testing (echo, CT, nuclear med)  3rd Floor: - Vacant  4th Floor: - TCTS (cardiothoracic surgery) - AFib Clinic - Structural Heart Clinic - Vascular Surgery  - Vascular Ultrasound  5th Floor: - HeartCare Cardiology (general and EP) - Clinical Pharmacy for coumadin, hypertension, lipid, weight-loss medications, and med management appointments    Valet parking services will be available as well.

## 2023-09-22 NOTE — Progress Notes (Unsigned)
 Erroneous encounter

## 2023-09-22 NOTE — Progress Notes (Signed)
 Virtual Visit via Video Note   This visit type was conducted due to national recommendations for restrictions regarding the COVID-19 Pandemic (e.g. social distancing) in an effort to limit this patient's exposure and mitigate transmission in our community.  Due to his co-morbid illnesses, this patient is at least at moderate risk for complications without adequate follow up.  This format is felt to be most appropriate for this patient at this time.  All issues noted in this document were discussed and addressed.  A limited physical exam was performed with this format.  Please refer to the patient's chart for his consent to telehealth for [facility].  Date:  09/22/2023   ID:  Bryan Stevenson, DOB 20-Feb-1956, MRN [id number] The patient was identified using 2 identifiers.  Patient Location: Home Provider Location: Home Office   PCP:  Joycelyn Rua, MD   [facility] Providers Cardiologist:  None Sleep Medicine:  Armanda Magic, MD     Evaluation Performed:  Follow-Up Visit  Chief Complaint:  OSA  History of Present Illness:    Bryan Stevenson is a 68 y.o. male with  with a hx of mild OSA with AHI 12.55/hr by PSG in 2008 and has been on CPAP therapy. He is doing well with his PAP device and thinks that he has gotten used to it.  He tolerates the full face mask and feels the pressure is adequate.  Since going on PAP he feels rested in the am and has no significant daytime sleepiness.  He denies any significant mouth or nasal dryness or nasal congestion.  He does not think that he snores.    Past Medical History:  Diagnosis Date   DVT (deep venous thrombosis) (HCC)    during leg fracture   Leg fracture, left    complicated by DVT   Obesity    OSA (obstructive sleep apnea)    on CPAP per Dr Mayford Knife- PSG 11/08 showed AHI of 12.55/hr   Past Surgical History:  Procedure Laterality Date   EYE SURGERY Right      Current Meds  Medication Sig   Ascorbic Acid (VITAMIN C PO) Take  1,000 mg by mouth.   CALCIUM-VITAMIN D PO Take 600 mg by mouth as directed.   loratadine (CLARITIN) 10 MG tablet Take 10 mg by mouth daily as needed for allergies.   Multiple Vitamins-Minerals (CENTRUM SILVER PO) Take by mouth daily.   NON FORMULARY CPAP     Allergies:   Patient has no known allergies.   Social History   Tobacco Use   Smoking status: Never   Smokeless tobacco: Never  Substance Use Topics   Alcohol use: Yes    Comment: 1-2 per week, wine   Drug use: No     Family Hx: The patient's family history includes Breast cancer in his mother; Diabetes type II in his father; Stroke in his father.  ROS:   Please see the history of present illness.     All other systems reviewed and are negative.   Prior CV studies:   The following studies were reviewed today:  PAP compliance download  Labs/Other Tests and Data Reviewed:    EKG:  No ECG reviewed.  Recent Labs: No results found for requested labs within last 365 days.   Recent Lipid Panel No results found for: "CHOL", "TRIG", "HDL", "CHOLHDL", "LDLCALC", "LDLDIRECT"  Wt Readings from Last 3 Encounters:  09/22/23 270 lb (122.5 kg)  09/22/23 270 lb (122.5 kg)  06/01/22 280  lb (127 kg)     Risk Assessment/Calculations:          Objective:    Vital Signs:  BP 128/80   Pulse 86   Ht 6\' 3"  (1.905 m)   Wt 270 lb (122.5 kg)   BMI 33.75 kg/m   Well nourished, well developed male in no acute distress. Well appearing, alert and conversant, regular work of breathing,  good skin color  Eyes- anicteric mouth- oral mucosa is pink  neuro- grossly intact skin- no apparent rash or lesions or cyanosis  ASSESSMENT & PLAN:    OSA - The patient is tolerating PAP therapy well without any problems. The PAP download performed by his DME was personally reviewed and interpreted by me today and showed an AHI of 1.5/hr on auto CPAP from 11 to 15 cm H2O with 100% compliance in using more than 4 hours nightly.  The patient  has been using and benefiting from PAP use and will continue to benefit from therapy.    Medication Adjustments/Labs and Tests Ordered: Current medicines are reviewed at length with the patient today.  Concerns regarding medicines are outlined above.   Tests Ordered: No orders of the defined types were placed in this encounter.    Medication Changes: No orders of the defined types were placed in this encounter.    Follow Up:  In Person in 1 year(s)  Signed, Armanda Magic, MD  09/22/2023 9:53 AM    [facility]

## 2023-10-12 DIAGNOSIS — E785 Hyperlipidemia, unspecified: Secondary | ICD-10-CM | POA: Diagnosis not present

## 2023-10-12 DIAGNOSIS — E7800 Pure hypercholesterolemia, unspecified: Secondary | ICD-10-CM | POA: Diagnosis not present

## 2023-10-12 DIAGNOSIS — H9193 Unspecified hearing loss, bilateral: Secondary | ICD-10-CM | POA: Diagnosis not present

## 2023-10-12 DIAGNOSIS — Z0000 Encounter for general adult medical examination without abnormal findings: Secondary | ICD-10-CM | POA: Diagnosis not present

## 2023-11-10 ENCOUNTER — Ambulatory Visit: Attending: Family Medicine | Admitting: Audiology

## 2023-11-10 DIAGNOSIS — H903 Sensorineural hearing loss, bilateral: Secondary | ICD-10-CM | POA: Insufficient documentation

## 2023-11-10 NOTE — Procedures (Signed)
  Outpatient Audiology and [facility] [address] [place], [place]  [address] [phone]  AUDIOLOGICAL  EVALUATION  NAME: [name]     DOB:   07-01-1955      MRN: [id number]                                                                                     DATE: 11/10/2023     REFERENT: Wyn Heater, MD STATUS: Outpatient DIAGNOSIS: sensorineural hearing loss, bilateral    History: Bryan Stevenson was seen for an audiological evaluation for a baseline audiological evaluation. Hilliard denies major concerns regarding his hearing sensitivity but would like a baseline audiogram for future monitoring. He denies concerns regarding his hearing sensitivity. Koren denies otalgia, aural fullness, and dizziness. Menashe reports intermittent tinnitus. The tinnitus is not bothersome. Mohd. reports occasional wax build up in his ears. He reports he had an ear infection in October. 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   Evaluation:  Otoscopy showed a clear view of the tympanic membranes, bilaterally Tympanometry results were consistent with normal middle ear pressure and normal tympanic membrane mobility (Type A), bilaterally.  Audiometric testing was completed using Conventional Audiometry techniques with insert earphones and TDH headphones. Test results are consistent with normal hearing sensitivity sloping to a moderately-severe sensorineural hearing loss, bilaterally. Speech Recognition Thresholds were obtained at 15 dB HL in the right ear and at 15  dB HL in the left ear. Word Recognition Testing was completed at 70 dB HL and Bryan Stevenson scored 96% in the right ear and 100% in the left ear.    Results:  The test results were reviewed with Bryan Stevenson. Today's test results are consistent with normal hearing sensitivity sloping to a moderately-severe sensorineural hearing loss in both ears. Zaydan may have hearing and communication  difficulty in many listening environments. He will benefit from the use of good communication strategies and the use of hearing aids if motivated. Bryan Stevenson was counseled regarding the use of hearing aids. Continue audiological monitoring was recommended.   Recommendations: 1.   Monitor hearing sensitivity. Return in 1-2 years for audiological monitoring.  2.   Communication needs assessment with an Audiologist to further discuss hearing aids if motivated to pursue amplification at this time.    30 minutes spent testing and counseling on results.   If you have any questions please feel free to contact me at [phone].  Bert Britain Audiologist, Au.D., CCC-A 11/10/2023  10:50 AM  Cc: Wyn Heater, MD

## 2024-01-03 DIAGNOSIS — L570 Actinic keratosis: Secondary | ICD-10-CM | POA: Diagnosis not present

## 2024-01-03 DIAGNOSIS — L820 Inflamed seborrheic keratosis: Secondary | ICD-10-CM | POA: Diagnosis not present

## 2024-02-14 DIAGNOSIS — K080 Exfoliation of teeth due to systemic causes: Secondary | ICD-10-CM | POA: Diagnosis not present

## 2024-03-25 DIAGNOSIS — R197 Diarrhea, unspecified: Secondary | ICD-10-CM | POA: Diagnosis not present

## 2024-03-29 DIAGNOSIS — L570 Actinic keratosis: Secondary | ICD-10-CM | POA: Diagnosis not present

## 2024-03-29 DIAGNOSIS — L918 Other hypertrophic disorders of the skin: Secondary | ICD-10-CM | POA: Diagnosis not present

## 2024-03-29 DIAGNOSIS — L820 Inflamed seborrheic keratosis: Secondary | ICD-10-CM | POA: Diagnosis not present

## 2024-03-29 DIAGNOSIS — D1801 Hemangioma of skin and subcutaneous tissue: Secondary | ICD-10-CM | POA: Diagnosis not present

## 2024-03-29 DIAGNOSIS — L309 Dermatitis, unspecified: Secondary | ICD-10-CM | POA: Diagnosis not present

## 2024-03-29 DIAGNOSIS — C44622 Squamous cell carcinoma of skin of right upper limb, including shoulder: Secondary | ICD-10-CM | POA: Diagnosis not present

## 2024-04-05 DIAGNOSIS — R197 Diarrhea, unspecified: Secondary | ICD-10-CM | POA: Diagnosis not present

## 2024-04-26 DIAGNOSIS — R197 Diarrhea, unspecified: Secondary | ICD-10-CM | POA: Diagnosis not present

## 2024-05-03 DIAGNOSIS — Z23 Encounter for immunization: Secondary | ICD-10-CM | POA: Diagnosis not present

## 2024-05-15 DIAGNOSIS — R21 Rash and other nonspecific skin eruption: Secondary | ICD-10-CM | POA: Diagnosis not present

## 2024-10-04 ENCOUNTER — Ambulatory Visit: Admitting: Cardiology
